# Patient Record
Sex: Male | Born: 1940 | Hispanic: Yes | Marital: Married | Smoking: Never smoker
Health system: Southern US, Community
[De-identification: ages and names within clinical notes are randomized; demographics above are authoritative.]

---

## 2015-03-05 ENCOUNTER — Encounter: Payer: Self-pay | Admitting: Emergency Medicine

## 2015-03-05 ENCOUNTER — Emergency Department: Payer: Self-pay

## 2015-03-05 ENCOUNTER — Emergency Department
Admission: EM | Admit: 2015-03-05 | Discharge: 2015-03-06 | Disposition: A | Discharge status: Home / Self Care | Payer: Self-pay | Attending: Emergency Medicine | Admitting: Emergency Medicine

## 2015-03-05 DIAGNOSIS — R05 Cough: Secondary | ICD-10-CM | POA: Insufficient documentation

## 2015-03-05 DIAGNOSIS — R110 Nausea: Secondary | ICD-10-CM | POA: Insufficient documentation

## 2015-03-05 DIAGNOSIS — R0600 Dyspnea, unspecified: Secondary | ICD-10-CM | POA: Insufficient documentation

## 2015-03-05 DIAGNOSIS — R079 Chest pain, unspecified: Secondary | ICD-10-CM | POA: Insufficient documentation

## 2015-03-05 LAB — BASIC METABOLIC PANEL
ANION GAP: 8 (ref 5–15)
BUN: 12 mg/dL (ref 6–20)
CHLORIDE: 101 mmol/L (ref 101–111)
CO2: 28 mmol/L (ref 22–32)
Calcium: 8.9 mg/dL (ref 8.9–10.3)
Creatinine, Ser: 0.89 mg/dL (ref 0.61–1.24)
GFR calc non Af Amer: >60 mL/min (ref >60)
Glucose, Bld: 122 mg/dL — ABNORMAL HIGH (ref 65–99)
POTASSIUM: 3.5 mmol/L (ref 3.5–5.1)
SODIUM: 137 mmol/L (ref 135–145)

## 2015-03-05 LAB — CBC
HEMATOCRIT: 43.7 % (ref 40.0–52.0)
Hemoglobin: 14.6 g/dL (ref 13.0–18.0)
MCH: 30.4 pg (ref 26.0–34.0)
MCHC: 33.5 g/dL (ref 32.0–36.0)
MCV: 90.8 fL (ref 80.0–100.0)
Platelets: 218 10*3/uL (ref 150–440)
RBC: 4.81 MIL/uL (ref 4.40–5.90)
RDW: 13.1 % (ref 11.5–14.5)
WBC: 6.1 10*3/uL (ref 3.8–10.6)

## 2015-03-05 LAB — URINALYSIS COMPLETE WITH MICROSCOPIC (ARMC ONLY)
BACTERIA UA: NONE SEEN
BILIRUBIN URINE: NEGATIVE
Glucose, UA: NEGATIVE mg/dL
Ketones, ur: NEGATIVE mg/dL
LEUKOCYTES UA: NEGATIVE
NITRITE: NEGATIVE
PH: 6 (ref 5.0–8.0)
Protein, ur: NEGATIVE mg/dL
SPECIFIC GRAVITY, URINE: 1.005 (ref 1.005–1.030)
Squamous Epithelial / LPF: NONE SEEN
WBC UA: NONE SEEN WBC/hpf (ref 0–5)

## 2015-03-05 LAB — TROPONIN I: Troponin I: 0.03 ng/mL (ref <0.031)

## 2015-03-05 MED ORDER — ALBUTEROL SULFATE (2.5 MG/3ML) 0.083% IN NEBU
2.5000 mg | INHALATION_SOLUTION | Freq: Once | RESPIRATORY_TRACT | Status: AC
Start: 2015-03-05 — End: 2015-03-05
  Administered 2015-03-05: 2.5 mg via RESPIRATORY_TRACT
  Filled 2015-03-05: qty 3

## 2015-03-05 MED ORDER — IBUPROFEN 600 MG PO TABS
600.0000 mg | ORAL_TABLET | Freq: Once | ORAL | Status: AC
  Administered 2015-03-05: 600 mg via ORAL
  Filled 2015-03-05: qty 1

## 2015-03-05 MED ORDER — ALBUTEROL SULFATE HFA 108 (90 BASE) MCG/ACT IN AERS: 2.0000 | INHALATION_SPRAY | Freq: Four times a day (QID) | RESPIRATORY_TRACT | Status: AC | PRN

## 2015-03-05 NOTE — ED Provider Notes (Signed)
[facility] Emergency Department Provider Note  ____________________________________________  Time seen: Approximately 950 PM  I have reviewed the triage vital signs and the nursing notes.   HISTORY  Chief Complaint Chest Pain  Interpreter, Hiram, utilize for translation   HPI Justin Brady is a 75 y.o. male without any chronic medical conditions who is presenting today with 2-3 days of chest pressure. He says that the pain was first intermittent and then started to be constant today. It is nonradiating. It is associated with some mild nausea but no vomiting. No sweating. No exertional factors. Says that he was sick with a bad cold about one week ago which has since mostly resolved except for a small amount of rhinorrhea and cough.Mild exacerbation of his pressure over his chest with deep breathing.   History reviewed. No pertinent past medical history.  There are no active problems to display for this patient.   History reviewed. No pertinent past surgical history.  No current outpatient prescriptions on file.  Allergies Review of patient's allergies indicates no known allergies.  No family history on file.  Social History Social History  Substance Use Topics  . Smoking status: Never Smoker   . Smokeless tobacco: Never Used  . Alcohol Use: No    Review of Systems Constitutional: No fever/chills Eyes: No visual changes. ENT: No sore throat. Cardiovascular: As above Respiratory: As above Gastrointestinal: No abdominal pain.  no vomiting.  No diarrhea.  No constipation. Genitourinary: Negative for dysuria. Musculoskeletal: Negative for back pain. Skin: Negative for rash. Neurological: Negative for headaches, focal weakness or numbness.  10-point ROS otherwise negative.  ____________________________________________   PHYSICAL EXAM:  VITAL SIGNS: ED Triage Vitals  Enc Vitals Group     BP 03/05/15 2100 146/63 mmHg     Pulse  Rate 03/05/15 2100 67     Resp 03/05/15 2100 16     Temp 03/05/15 2100 97.6 F (36.4 C)     Temp Source 03/05/15 2100 Oral     SpO2 03/05/15 2100 97 %     Weight 03/05/15 2100 110 lb (49.896 kg)     Height 03/05/15 2100  (1.549 m)     Head Cir --      Peak Flow --      Pain Score 03/05/15 2102 5     Pain Loc --      Pain Edu? --      Excl. in GC? --     Constitutional: Alert and oriented. Well appearing and in no acute distress. Eyes: Conjunctivae are normal. PERRL. EOMI. Head: Atraumatic. Nose: No congestion/rhinnorhea. Mouth/Throat: Mucous membranes are moist.  Oropharynx non-erythematous. Neck: No stridor.   Cardiovascular: Normal rate, regular rhythm. Grossly normal heart sounds.  Good peripheral circulation. Chest pain not reproducible to palpation. Respiratory: Normal respiratory effort.  No retractions. Lungs CTAB. Gastrointestinal: Soft and nontender. No distention. No abdominal bruits. No CVA tenderness. Musculoskeletal: No lower extremity tenderness nor edema.  No joint effusions. Neurologic:  Normal speech and language. No gross focal neurologic deficits are appreciated. No gait instability. Skin:  Skin is warm, dry and intact. No rash noted. Psychiatric: Mood and affect are normal. Speech and behavior are normal.  ____________________________________________   LABS (all labs ordered are listed, but only abnormal results are displayed)  Labs Reviewed  BASIC METABOLIC PANEL - Abnormal; Notable for the following:    Glucose, Bld 122 (*)    All other components within normal limits  URINALYSIS COMPLETEWITH MICROSCOPIC [facility]  ONLY) - Abnormal; Notable for the following:    Color, Urine YELLOW (*)    APPearance CLEAR (*)    Hgb urine dipstick 1+ (*)    All other components within normal limits  CBC  TROPONIN I   ____________________________________________  EKG  ED ECG REPORT I, Nalani Andreen,  Teena Irani, the attending physician, personally viewed and  interpreted this ECG.   Date: 03/05/2015  EKG Time: 2054  Rate: 73  Rhythm: normal sinus rhythm  Axis: Normal  Intervals:none  ST&T Change: No ST segment elevation or depression. No abnormal T-wave inversion.  ____________________________________________  RADIOLOGY  Cardiac enlargement. No evidence of active cardiopulmonary disease. ____________________________________________   PROCEDURES  ____________________________________________   INITIAL IMPRESSION / ASSESSMENT AND PLAN / ED COURSE  Pertinent labs & imaging results that were available during my care of the patient were reviewed by me and considered in my medical decision making (see chart for details).  Possible mild degree of bronchospasm with pleurisy secondary to recent viral illness. We'll give breathing treatment and ibuprofen and reassess. Very reassuring EKG as well as normal troponin. Feel that ACS and pulmonary embolus are very much less likely given the recent events.    ----------------------------------------- 11:46 PM on 03/05/2015 -----------------------------------------  Patient says that his shortness of breath and chest pressure greatly relieved after the breathing treatment but he still has a small amount of pressure to the center of his chest. Because he is still symptomatically I will order a d-dimer because he had a recent flight from [place] as well as his pleuritic chest pain. We will also tenderness troponin. Signed out to Dr. Manson Passey. Patient aware of further testing. Interpreter services utilized. ____________________________________________   FINAL CLINICAL IMPRESSION(S) / ED DIAGNOSES  Chest pain. Dyspnea.    Myrna Blazer, MD 03/05/15 [phone]

## 2015-03-05 NOTE — ED Notes (Signed)
C/O chest tightness x 3 days and some shortness of breath today.  Patient states he feels anxious with the chest pain episodes.  States initially went to [place] (about 1 week ago) and he was sick with fever and cough.  He was treated with a shot at the hospital and some medicine to continue taking at home, Tussin CF. Patient has been in [place] for about one week visiting daughter.

## 2015-03-06 LAB — FIBRIN DERIVATIVES D-DIMER (ARMC ONLY): FIBRIN DERIVATIVES D-DIMER (ARMC): 347 (ref 0–499)

## 2015-03-06 LAB — TROPONIN I: Troponin I: 0.03 ng/mL (ref <0.031)

## 2015-03-06 NOTE — ED Provider Notes (Signed)
Assumed care of the patient at 11:30 PM from Dr. Langston Masker. Repeat troponin negative d-dimer also negative. Based on Dr. Langston Masker recommendation patient will be discharged home with outpatient follow-up.  Darci Current, MD 03/06/15 [phone]

## 2015-03-07 ENCOUNTER — Emergency Department: Payer: Self-pay

## 2015-03-07 ENCOUNTER — Emergency Department
Admission: EM | Admit: 2015-03-07 | Discharge: 2015-03-07 | Disposition: A | Discharge status: Home / Self Care | Payer: Self-pay | Attending: Emergency Medicine | Admitting: Emergency Medicine

## 2015-03-07 ENCOUNTER — Encounter: Payer: Self-pay | Admitting: Emergency Medicine

## 2015-03-07 DIAGNOSIS — J209 Acute bronchitis, unspecified: Secondary | ICD-10-CM | POA: Insufficient documentation

## 2015-03-07 LAB — BASIC METABOLIC PANEL
Anion gap: 10 (ref 5–15)
BUN: 16 mg/dL (ref 6–20)
CALCIUM: 9.2 mg/dL (ref 8.9–10.3)
CHLORIDE: 100 mmol/L — AB (ref 101–111)
CO2: 27 mmol/L (ref 22–32)
CREATININE: 0.9 mg/dL (ref 0.61–1.24)
GFR calc non Af Amer: >60 mL/min (ref >60)
Glucose, Bld: 117 mg/dL — ABNORMAL HIGH (ref 65–99)
Potassium: 4 mmol/L (ref 3.5–5.1)
SODIUM: 137 mmol/L (ref 135–145)

## 2015-03-07 LAB — CBC
HEMATOCRIT: 44.1 % (ref 40.0–52.0)
Hemoglobin: 14.8 g/dL (ref 13.0–18.0)
MCH: 30.3 pg (ref 26.0–34.0)
MCHC: 33.4 g/dL (ref 32.0–36.0)
MCV: 90.7 fL (ref 80.0–100.0)
PLATELETS: 223 10*3/uL (ref 150–440)
RBC: 4.87 MIL/uL (ref 4.40–5.90)
RDW: 13.2 % (ref 11.5–14.5)
WBC: 5.8 10*3/uL (ref 3.8–10.6)

## 2015-03-07 LAB — TROPONIN I: Troponin I: <0.03 ng/mL (ref <0.031)

## 2015-03-07 MED ORDER — IOHEXOL 350 MG/ML SOLN
100.0000 mL | Freq: Once | INTRAVENOUS | Status: AC | PRN
Start: 2015-03-07 — End: 2015-03-07
  Administered 2015-03-07: 100 mL via INTRAVENOUS

## 2015-03-07 MED ORDER — SODIUM CHLORIDE 0.9 % IV BOLUS (SEPSIS)
1000.0000 mL | Freq: Once | INTRAVENOUS | Status: AC
  Administered 2015-03-07: 1000 mL via INTRAVENOUS

## 2015-03-07 MED ORDER — HYDROCODONE-HOMATROPINE 5-1.5 MG/5ML PO SYRP: 5.0000 mL | ORAL_SOLUTION | Freq: Four times a day (QID) | ORAL | Status: AC | PRN

## 2015-03-07 MED ORDER — GUAIFENESIN 100 MG/5ML PO SOLN: 5.0000 mL | ORAL | Status: AC | PRN

## 2015-03-07 NOTE — ED Notes (Addendum)
Was seen 5 days ago with chest pain   States he is still having pain to chest   Pain with inspiration  And has had slight cough  States pain was better and then returned this am    States he gets anxiuos

## 2015-03-07 NOTE — Discharge Instructions (Signed)
Resulto de CT scan: EXAM: CT ANGIOGRAPHY CHEST WITH CONTRAST  TECHNIQUE: Multidetector CT imaging of the chest was performed using the standard protocol during bolus administration of intravenous contrast. Multiplanar CT image reconstructions and MIPs were obtained to evaluate the vascular anatomy.  CONTRAST: OMNIPAQUE IOHEXOL 350 MG/ML SOLN  COMPARISON: Chest radiographs obtained earlier today.  FINDINGS: Mediastinum/Lymph Nodes: No pulmonary emboli or thoracic aortic dissection identified. No masses or pathologically enlarged lymph nodes identified.  Lungs/Pleura: Mild diffuse peribronchial thickening and accentuation of the interstitial markings. No lung nodules. No pleural fluid. Subsegmental atelectasis in the lingula and right middle lobe.  Upper abdomen: No acute findings.  Musculoskeletal: Minimal thoracic spine degenerative changes.  Review of the MIP images confirms the above findings.  IMPRESSION: 1. No pulmonary emboli. 2. Mild bronchitic changes.  Bronquitis aguda (Acute Bronchitis) La bronquitis es una inflamacin de las vas respiratorias que se extienden desde la trquea [employer] pulmones (bronquios). La inflamacin produce la formacin de mucosidad. Esto produce tos, que es el sntoma ms frecuente de la bronquitis.  Cuando la bronquitis es [place], generalmente comienza de [place] sbita y desaparece luego de un par de semanas. El hbito de fumar, las alergias y el asma pueden empeorar la bronquitis. Los episodios repetidos de bronquitis pueden causar ms problemas pulmonares.  CAUSAS La causa ms frecuente de bronquitis aguda es el mismo virus que produce el resfro. El virus puede propagarse de Neomia Dear persona a la otra (contagioso) a travs de la tos y los estornudos, y al tocar objetos contaminados. SIGNOS Y SNTOMAS   Tos.  Grant Ruts.  Tos con mucosidad.  Dolores [employer] cuerpo.  Congestin en el pecho.  Escalofros.  Falta de  aire.  Dolor de Advertising copywriter. DIAGNSTICO  La bronquitis aguda en general se diagnostica con un examen fsico. El mdico tambin le har preguntas sobre su historia clnica. En algunos casos se indican otros estudios, como radiografas, para Risk manager.  TRATAMIENTO  La bronquitis aguda generalmente desaparece en un par de semanas. Con frecuencia, no es Quarry manager. Los medicamentos se indican para aliviar la fiebre o la tos. Generalmente, no es necesario el uso de antibiticos, pero pueden recetarse en ciertas ocasiones. En algunos casos, se recomienda el uso de un inhalador para mejorar la falta de aire y Scientist, physiological tos. Un vaporizador de aire fro podr ayudarlo a [employer] bronquiales y Statistician su eliminacin.  INSTRUCCIONES PARA EL CUIDADO EN EL HOGAR  Descanse lo suficiente.  Beba lquidos en abundancia para mantener la orina de color claro o amarillo plido (excepto que padezca una enfermedad que requiera la restriccin de lquidos). El aumento de lquidos puede ayudar a que las secreciones respiratorias (esputo) sean menos espesas y a reducir la congestin del pecho, y Transport planner deshidratacin.  Tome los medicamentos solamente como se lo haya indicado el mdico.  Si le recetaron antibiticos, asegrese de terminarlos, incluso si comienza a sentirse mejor.  Evite fumar o aspirar el humo de otros fumadores. La exposicin al humo del cigarrillo o a irritantes qumicos har que la bronquitis empeore. Si fuma, considere el uso de goma de Theatre manager o la aplicacin de parches en la piel que contengan nicotina para Paramedic los sntomas de abstinencia. Si deja de fumar, sus pulmones se curarn ms rpido.  Reduzca la probabilidad de otro episodio de bronquitis aguda lavando sus manos con frecuencia, evitando a las personas que tengan sntomas y tratando de no tocarse las manos con la boca, la Clinical cytogeneticist  o los ojos.  Concurra a todas las visitas de  control como se lo haya indicado el mdico. SOLICITE ATENCIN MDICA SI: Los sntomas no mejoran despus de una semana de [place].  SOLICITE ATENCIN MDICA DE INMEDIATO SI:  Comienza a tener fiebre o escalofros cada vez ms intensos.  Siente dolor en el pecho.  Le falta el aire de manera preocupante.  La flema tiene [place].  Se deshidrata.  Se desmaya o siente que va a desmayarse de forma repetida.  Tiene vmitos que se repiten.  Tiene un dolor de cabeza intenso. ASEGRESE DE QUE:   Comprende estas instrucciones.  Controlar su afeccin.  Recibir ayuda de inmediato si no mejora o si empeora.   Esta informacin no tiene Theme park manager el consejo del mdico. Asegrese de hacerle al mdico cualquier pregunta que tenga.   Document Released: 01/04/2005 Document Revised: 01/25/2014 Elsevier Interactive Patient Education [employer]

## 2015-03-07 NOTE — ED Provider Notes (Signed)
[facility] Emergency Department Provider Note  ____________________________________________  Time seen: 7:00 PM  I have reviewed the triage vital signs and the nursing notes.   HISTORY  Chief Complaint Chest Pain    HPI Justin Brady is a 75 y.o. male brought to the ED by family member for 5 days of constant chest pain. Described as heavy and worse with breathing. He was seen in the ED 2 days ago had negative workup including d-dimer and chest x-ray. Has persistent chest pain since then. Nonexertional. No fever. Has nonproductive cough as well. Recent travel from [place]. No known medical history, does not take any medications or have any allergies. No recent hospitalizations surgeries, no history of venous thromboembolism. Pain is sharp and tight. Nonradiating. No aggravating or alleviating factors other than deep breathing makes it worse.    History reviewed. No pertinent past medical history.   There are no active problems to display for this patient.    History reviewed. No pertinent past surgical history.   Current Outpatient Rx  Name  Route  Sig  Dispense  Refill  . albuterol (PROVENTIL HFA;VENTOLIN HFA) 108 (90 Base) MCG/ACT inhaler   Inhalation   Inhale 2 puffs into the lungs every 6 (six) hours as needed for wheezing or shortness of breath.   1 Inhaler   0   . guaiFENesin (ROBITUSSIN) 100 MG/5ML SOLN   Oral   Take 5 mLs (100 mg total) by mouth every 4 (four) hours as needed for cough or to loosen phlegm.   120 mL   0   . HYDROcodone-homatropine (HYCODAN) 5-1.5 MG/5ML syrup   Oral   Take 5 mLs by mouth every 6 (six) hours as needed for cough.   120 mL   0      Allergies Review of patient's allergies indicates no known allergies.   No family history on file.  Social History Social History  Substance Use Topics  . Smoking status: Never Smoker   . Smokeless tobacco: Never Used  . Alcohol Use: No    Review of  Systems  Constitutional:   No fever or chills. No weight changes Eyes:   No blurry vision or double vision.  ENT:   No sore throat.  Cardiovascular:   Is positive as above pleuritic chest pain Respiratory:   Positive shortness of breath and nonproductive cough.. Gastrointestinal:   Negative for abdominal pain, vomiting and diarrhea.  No BRBPR or melena. Genitourinary:   Negative for dysuria or difficulty urinating. Musculoskeletal:   Negative for back pain. No joint swelling or pain. Skin:   Negative for rash. Neurological:   Negative for headaches, focal weakness or numbness. Psychiatric:  No anxiety or depression.   Endocrine:  No changes in energy or sleep difficulty.  10-point ROS otherwise negative.  ____________________________________________   PHYSICAL EXAM:  VITAL SIGNS: ED Triage Vitals  Enc Vitals Group     BP 03/07/15 1705 146/70 mmHg     Pulse Rate 03/07/15 1705 75     Resp 03/07/15 1705 20     Temp 03/07/15 1705 97 F (36.1 C)     Temp Source 03/07/15 1705 Oral     SpO2 03/07/15 1705 97 %     Weight 03/07/15 1705 110 lb (49.896 kg)     Height 03/07/15 1705  (1.575 m)     Head Cir --      Peak Flow --      Pain Score 03/07/15 1705 4  Pain Loc --      Pain Edu? --      Excl. in GC? --     Vital signs reviewed, nursing assessments reviewed.   Constitutional:   Alert and oriented. Well appearing and in no distress. Eyes:   No scleral icterus. No conjunctival pallor. PERRL. EOMI ENT   Head:   Normocephalic and atraumatic.   Nose:   No congestion/rhinnorhea. No septal hematoma   Mouth/Throat:   MMM, mild pharyngeal erythema. No peritonsillar mass.    Neck:   No stridor. No SubQ emphysema. No meningismus. Hematological/Lymphatic/Immunilogical:   No cervical lymphadenopathy. Cardiovascular:   RRR. Symmetric bilateral radial and DP pulses.  No murmurs.  Respiratory:   Normal respiratory effort without tachypnea nor retractions. Breath  sounds are clear and equal bilaterally. Mild expiratory wheezing diffusely  Gastrointestinal:   Soft and nontender. Non distended. There is no CVA tenderness.  No rebound, rigidity, or guarding. Genitourinary:   deferred Musculoskeletal:   Nontender with normal range of motion in all extremities. No joint effusions.  No lower extremity tenderness.  No edema. Chest wall nontender Neurologic:   Normal speech and language.  CN 2-10 normal. Motor grossly intact. No gross focal neurologic deficits are appreciated.  Skin:    Skin is warm, dry and intact. No rash noted.  No petechiae, purpura, or bullae. Psychiatric:   Mood and affect are normal____________________________________________    LABS (pertinent positives/negatives) (all labs ordered are listed, but only abnormal results are displayed) Labs Reviewed  BASIC METABOLIC PANEL - Abnormal; Notable for the following:    Chloride 100 (*)    Glucose, Bld 117 (*)    All other components within normal limits  TROPONIN I  CBC   ____________________________________________   EKG  Interpreted by me Normal sinus rhythm rate of 70, normal axis intervals QRS and ST segments and T waves.  ____________________________________________    RADIOLOGY  CT angiogram chest negative for PE, no other acute pathology. Shows some mild bronchitic changes consistent with viral respiratory illness. ____________________________________________   PROCEDURES   ____________________________________________   INITIAL IMPRESSION / ASSESSMENT AND PLAN / ED COURSE  Pertinent labs & imaging results that were available during my care of the patient were reviewed by me and considered in my medical decision making (see chart for details).  Patient presents with pleuritic chest pain and shortness of breath constant for the past 4 or 5 days without relief. Given previous ED workup which was unrevealing and persistence of symptoms, we'll proceed to CT angios  of the chest to evaluate for PE especially with a lot of recent travel.  ----------------------------------------- 9:05 PM on 03/07/2015 -----------------------------------------  Workup negative. Vital stable. Blood pressure has improved to the prehypertensive range so I would not start on any antihypertensive at this time. Follow-up with primary care.     ____________________________________________   FINAL CLINICAL IMPRESSION(S) / ED DIAGNOSES  Final diagnoses:  Acute bronchitis, unspecified organism      Sharman Cheek, MD 03/07/15 2105

## 2017-08-16 IMAGING — CT CT ANGIO CHEST
1 of 2 series · 18 of 30 positions shown · IV contrast (APPLIED)
Comparison: Chest radiographs obtained earlier today.

CLINICAL DATA: Chest pain and shortness of breath for the past 3
days.

EXAM:
CT ANGIOGRAPHY CHEST WITH CONTRAST
TECHNIQUE: Multidetector CT imaging of the chest was performed using the
standard protocol during bolus administration of intravenous
contrast. Multiplanar CT image reconstructions and MIPs were
obtained to evaluate the vascular anatomy.
CONTRAST:  100mL OMNIPAQUE IOHEXOL 350 MG/ML SOLN

[Series 5: pe 1.0 thins · axial · 0.68mm/px · z∈[-1366,-1136]mm · 18 of 260 slices shown]
[im 15/260  lung]
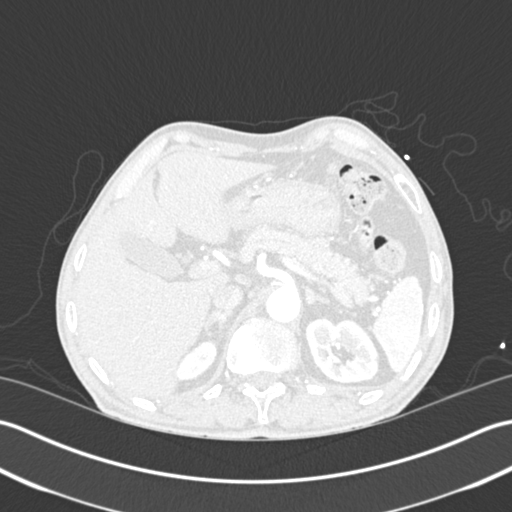
[im 29/260  mediastinal]
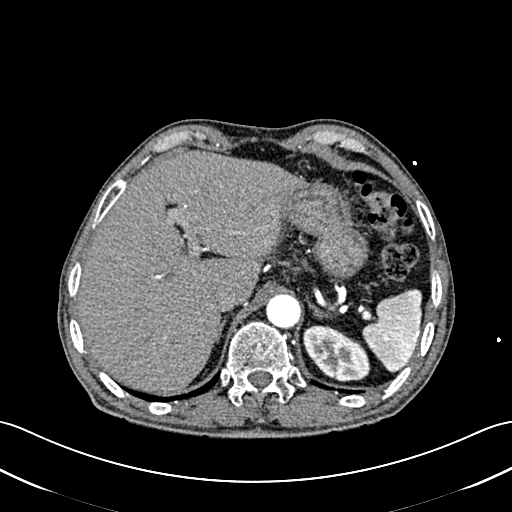
[im 44/260  lung]
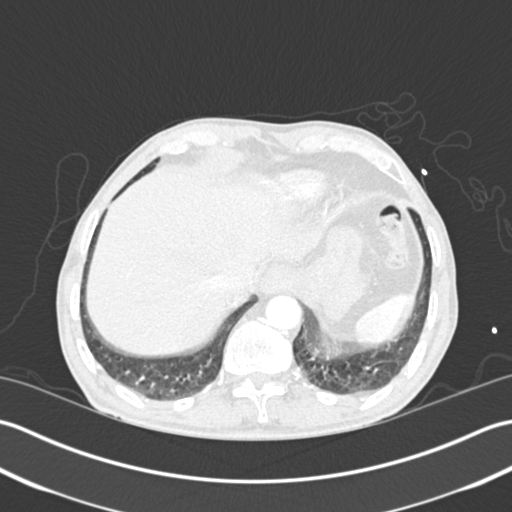
[im 58/260  mediastinal]
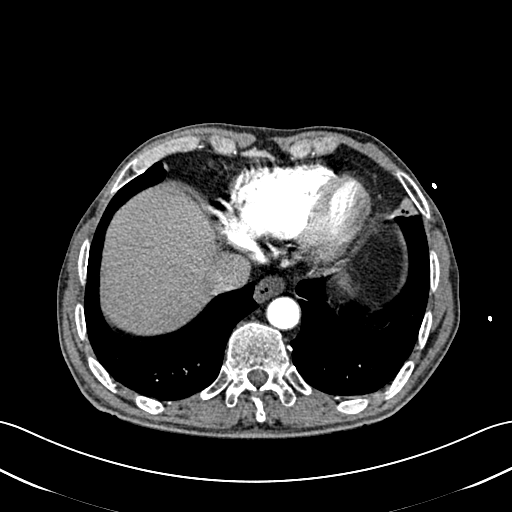
[im 72/260  lung]
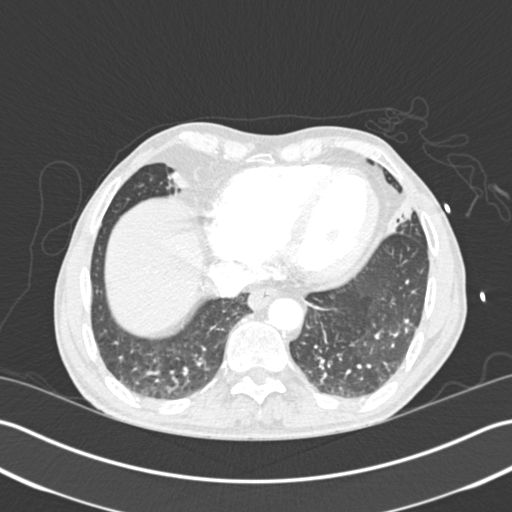
[im 87/260  mediastinal]
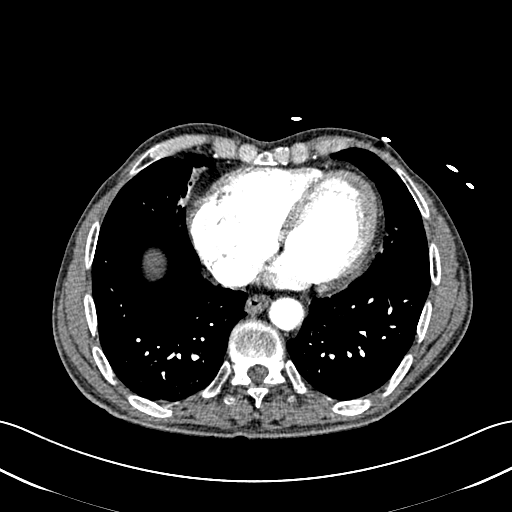
[im 101/260  lung]
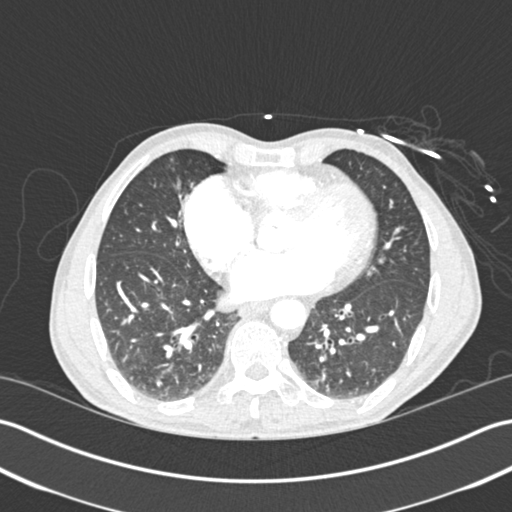
[im 116/260  mediastinal]
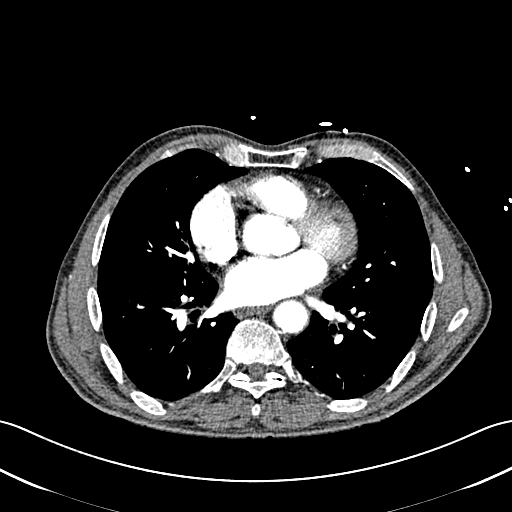
[im 121/260  lung]
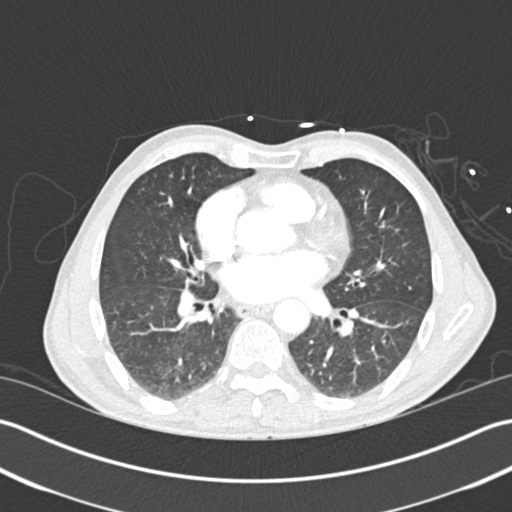
[im 130/260  mediastinal]
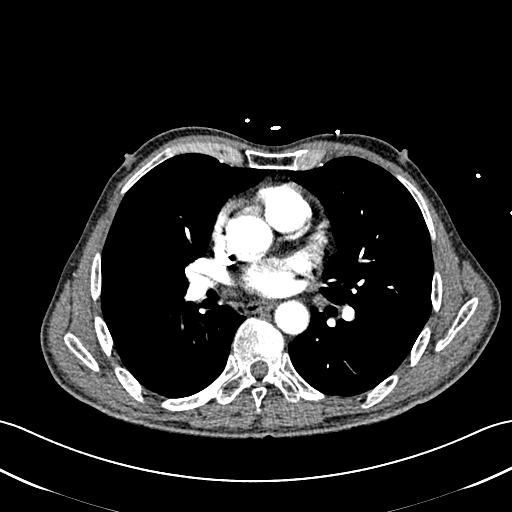
[im 144/260  lung]
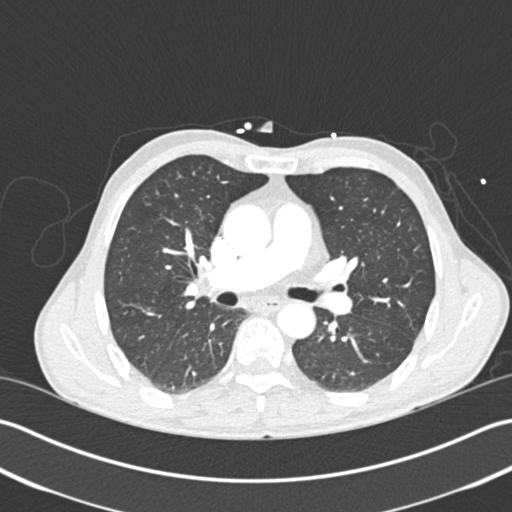
[im 159/260  mediastinal]
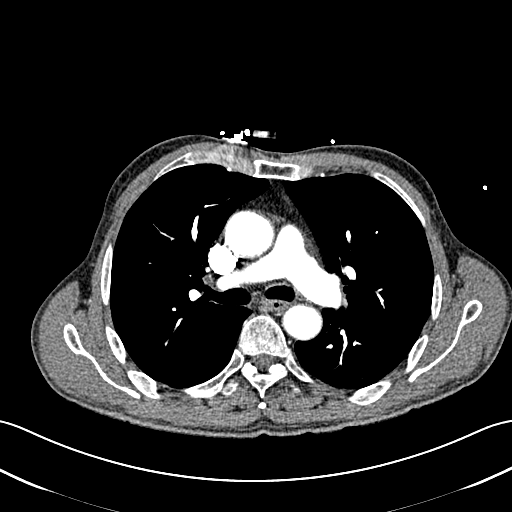
[im 173/260  lung]
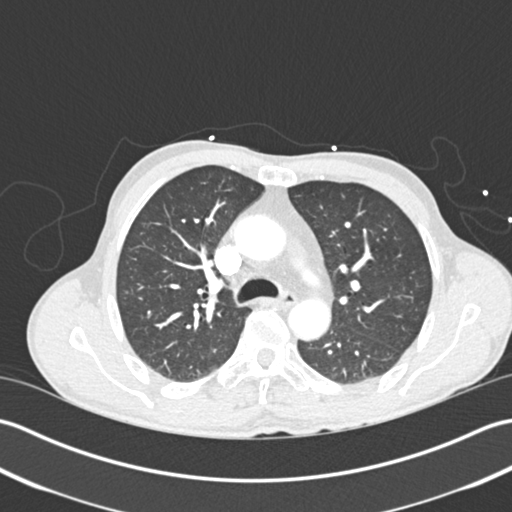
[im 188/260  mediastinal]
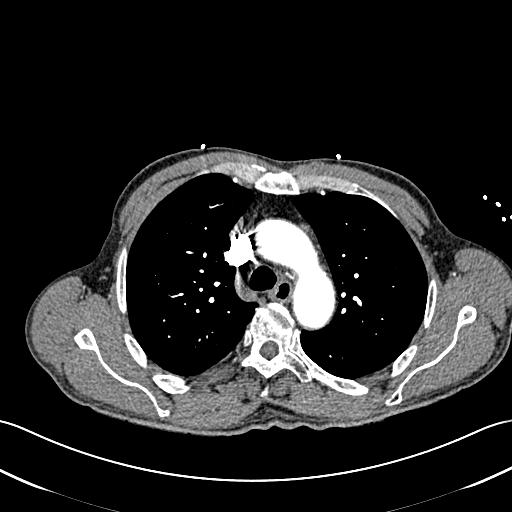
[im 202/260  lung]
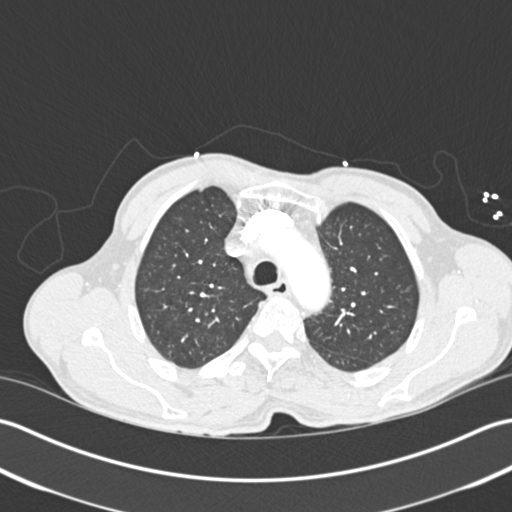
[im 216/260  mediastinal]
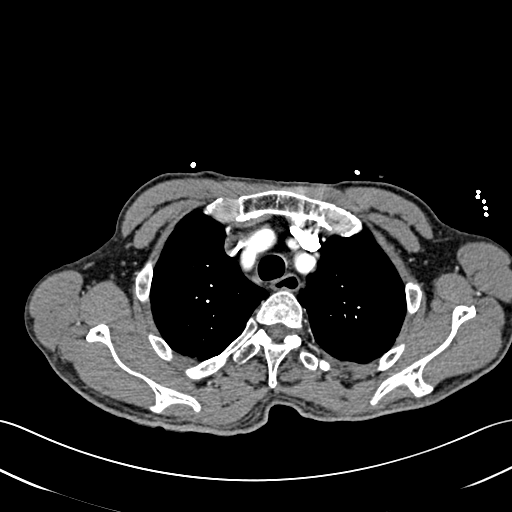
[im 231/260  lung]
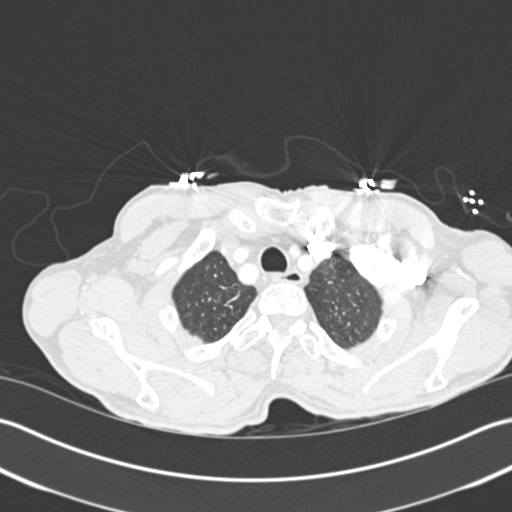
[im 245/260  mediastinal]
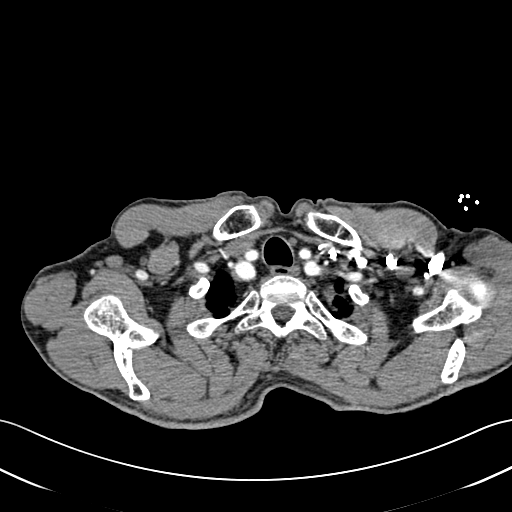

[18 of 30 positions shown; findings below may reference images not displayed]

FINDINGS: Mediastinum/Lymph Nodes: No pulmonary emboli or thoracic aortic
dissection identified. No masses or pathologically enlarged lymph
nodes identified.

Lungs/Pleura: Mild diffuse peribronchial thickening and accentuation
of the interstitial markings. No lung nodules. No pleural fluid.
Subsegmental atelectasis in the lingula and right middle lobe.

Upper abdomen: No acute findings.

Musculoskeletal: Minimal thoracic spine degenerative changes.

Review of the MIP images confirms the above findings.
IMPRESSION: 1. No pulmonary emboli.
2. Mild bronchitic changes.
